# Patient Record
Sex: Male | Born: 1935 | Race: White | Hispanic: No | Marital: Married
Health system: Southern US, Community
[De-identification: ages and names within clinical notes are randomized; demographics above are authoritative.]

---

## 2003-05-08 ENCOUNTER — Other Ambulatory Visit: Payer: Self-pay

## 2003-05-15 ENCOUNTER — Other Ambulatory Visit: Payer: Self-pay

## 2005-03-17 ENCOUNTER — Ambulatory Visit: Payer: Self-pay | Admitting: Internal Medicine

## 2005-04-05 ENCOUNTER — Ambulatory Visit: Payer: Self-pay | Admitting: General Surgery

## 2006-04-05 ENCOUNTER — Other Ambulatory Visit: Payer: Self-pay

## 2006-04-05 ENCOUNTER — Inpatient Hospital Stay: Payer: Self-pay | Admitting: Unknown Physician Specialty

## 2010-09-11 ENCOUNTER — Emergency Department: Payer: Self-pay | Admitting: Emergency Medicine

## 2011-09-24 ENCOUNTER — Inpatient Hospital Stay: Payer: Self-pay | Admitting: Internal Medicine

## 2011-09-24 LAB — COMPREHENSIVE METABOLIC PANEL
Alkaline Phosphatase: 71 U/L (ref 50–136)
Anion Gap: 7 (ref 7–16)
BUN: 15 mg/dL (ref 7–18)
Calcium, Total: 10.4 mg/dL — ABNORMAL HIGH (ref 8.5–10.1)
Chloride: 104 mmol/L (ref 98–107)
Co2: 31 mmol/L (ref 21–32)
Creatinine: 1.02 mg/dL (ref 0.60–1.30)
EGFR (African American): >60
Potassium: 3.2 mmol/L — ABNORMAL LOW (ref 3.5–5.1)
SGOT(AST): 37 U/L (ref 15–37)
SGPT (ALT): 8 U/L — ABNORMAL LOW
Sodium: 142 mmol/L (ref 136–145)

## 2011-09-24 LAB — PROTIME-INR
INR: 1
Prothrombin Time: 13.4 secs (ref 11.5–14.7)

## 2011-09-24 LAB — CBC WITH DIFFERENTIAL/PLATELET
Basophil #: 0 x10 3/mm 3
Basophil %: 0.1 %
Eosinophil #: 0 x10 3/mm 3
Eosinophil %: 0.2 %
HCT: 42.5 %
HGB: 14.4 g/dL
Lymphocyte %: 11.4 %
Lymphs Abs: 1.4 x10 3/mm 3
MCH: 35 pg — ABNORMAL HIGH
MCHC: 34 g/dL
MCV: 103 fL — ABNORMAL HIGH
Monocyte #: 0.7 "x10 3/mm "
Monocyte %: 5.5 %
Neutrophil #: 10.2 x10 3/mm 3 — ABNORMAL HIGH
Neutrophil %: 82.8 %
Platelet: 148 x10 3/mm 3 — ABNORMAL LOW
RBC: 4.13 x10 6/mm 3 — ABNORMAL LOW
RDW: 11.9 %
WBC: 12.3 x10 3/mm 3 — ABNORMAL HIGH

## 2011-09-24 LAB — APTT: Activated PTT: 26.2 s

## 2011-09-25 LAB — URINALYSIS, COMPLETE
Bacteria: NONE SEEN
Bilirubin,UR: NEGATIVE
Glucose,UR: NEGATIVE mg/dL (ref 0–75)
Leukocyte Esterase: NEGATIVE
Protein: NEGATIVE
RBC,UR: 2 /HPF (ref 0–5)
Specific Gravity: 1.01 (ref 1.003–1.030)
Squamous Epithelial: NONE SEEN
WBC UR: 5 /HPF (ref 0–5)

## 2011-09-26 LAB — BASIC METABOLIC PANEL
Anion Gap: 10 (ref 7–16)
BUN: 34 mg/dL — ABNORMAL HIGH (ref 7–18)
Calcium, Total: 9.6 mg/dL (ref 8.5–10.1)
Chloride: 105 mmol/L (ref 98–107)
Co2: 31 mmol/L (ref 21–32)
Creatinine: 1.19 mg/dL (ref 0.60–1.30)
Glucose: 126 mg/dL — ABNORMAL HIGH (ref 65–99)
Osmolality: 300 (ref 275–301)
Potassium: 3.2 mmol/L — ABNORMAL LOW (ref 3.5–5.1)
Sodium: 146 mmol/L — ABNORMAL HIGH (ref 136–145)

## 2011-09-26 LAB — TROPONIN I: Troponin-I: 0.08 ng/mL — ABNORMAL HIGH

## 2011-09-27 LAB — CBC WITH DIFFERENTIAL/PLATELET
Eosinophil #: 0.1 10*3/uL (ref 0.0–0.7)
Eosinophil %: 0.5 %
Lymphocyte #: 1.5 10*3/uL (ref 1.0–3.6)
MCH: 35.9 pg — ABNORMAL HIGH (ref 26.0–34.0)
MCV: 107 fL — ABNORMAL HIGH (ref 80–100)
Monocyte #: 0.8 x10 3/mm (ref 0.2–1.0)
Neutrophil #: 10.4 10*3/uL — ABNORMAL HIGH (ref 1.4–6.5)
Platelet: 160 10*3/uL (ref 150–440)
RBC: 3.34 10*6/uL — ABNORMAL LOW (ref 4.40–5.90)
RDW: 12.4 % (ref 11.5–14.5)
WBC: 12.8 10*3/uL — ABNORMAL HIGH (ref 3.8–10.6)

## 2011-09-27 LAB — BASIC METABOLIC PANEL
BUN: 31 mg/dL — ABNORMAL HIGH (ref 7–18)
Co2: 31 mmol/L (ref 21–32)
EGFR (African American): >60
Glucose: 117 mg/dL — ABNORMAL HIGH (ref 65–99)
Potassium: 3.5 mmol/L (ref 3.5–5.1)

## 2011-09-28 LAB — CBC WITH DIFFERENTIAL/PLATELET
Basophil #: 0 10*3/uL (ref 0.0–0.1)
Basophil %: 0 %
Eosinophil #: 0 10*3/uL (ref 0.0–0.7)
Eosinophil %: 0.1 %
HCT: 37.1 % — ABNORMAL LOW (ref 40.0–52.0)
HGB: 12.4 g/dL — ABNORMAL LOW (ref 13.0–18.0)
Lymphocyte #: 0.9 10*3/uL — ABNORMAL LOW (ref 1.0–3.6)
Lymphocyte %: 8.1 %
MCH: 34.6 pg — ABNORMAL HIGH (ref 26.0–34.0)
MCV: 104 fL — ABNORMAL HIGH (ref 80–100)
Monocyte #: 0.8 x10 3/mm (ref 0.2–1.0)
Monocyte %: 7.6 %
Neutrophil #: 9.1 10*3/uL — ABNORMAL HIGH (ref 1.4–6.5)
Neutrophil %: 84.2 %
RBC: 3.57 10*6/uL — ABNORMAL LOW (ref 4.40–5.90)
RDW: 12.4 % (ref 11.5–14.5)
WBC: 10.8 10*3/uL — ABNORMAL HIGH (ref 3.8–10.6)

## 2011-09-28 LAB — BASIC METABOLIC PANEL
Anion Gap: 10 (ref 7–16)
Calcium, Total: 9.5 mg/dL (ref 8.5–10.1)
Chloride: 103 mmol/L (ref 98–107)
Co2: 31 mmol/L (ref 21–32)
EGFR (African American): >60
EGFR (Non-African Amer.): >60
Glucose: 112 mg/dL — ABNORMAL HIGH (ref 65–99)
Osmolality: 289 (ref 275–301)
Sodium: 144 mmol/L (ref 136–145)

## 2011-09-29 LAB — CBC WITH DIFFERENTIAL/PLATELET
Basophil #: 0 10*3/uL (ref 0.0–0.1)
Eosinophil #: 0.1 10*3/uL (ref 0.0–0.7)
Eosinophil %: 0.6 %
HCT: 31 % — ABNORMAL LOW (ref 40.0–52.0)
HGB: 10.4 g/dL — ABNORMAL LOW (ref 13.0–18.0)
Lymphocyte %: 11.6 %
MCHC: 33.5 g/dL (ref 32.0–36.0)
MCV: 104 fL — ABNORMAL HIGH (ref 80–100)
Monocyte #: 0.8 x10 3/mm (ref 0.2–1.0)
Monocyte %: 7.4 %
Neutrophil #: 8.2 10*3/uL — ABNORMAL HIGH (ref 1.4–6.5)
Neutrophil %: 80.3 %
RBC: 2.98 10*6/uL — ABNORMAL LOW (ref 4.40–5.90)
RDW: 11.8 % (ref 11.5–14.5)
WBC: 10.2 10*3/uL (ref 3.8–10.6)

## 2011-09-30 LAB — CBC WITH DIFFERENTIAL/PLATELET
Basophil #: 0 10*3/uL (ref 0.0–0.1)
Eosinophil #: 0.2 10*3/uL (ref 0.0–0.7)
HCT: 30.4 % — ABNORMAL LOW (ref 40.0–52.0)
HGB: 10.2 g/dL — ABNORMAL LOW (ref 13.0–18.0)
MCH: 35 pg — ABNORMAL HIGH (ref 26.0–34.0)
MCHC: 33.7 g/dL (ref 32.0–36.0)
Monocyte #: 0.6 x10 3/mm (ref 0.2–1.0)
RBC: 2.93 10*6/uL — ABNORMAL LOW (ref 4.40–5.90)
RDW: 12 % (ref 11.5–14.5)
WBC: 8.2 10*3/uL (ref 3.8–10.6)

## 2011-09-30 LAB — BASIC METABOLIC PANEL
BUN: 18 mg/dL (ref 7–18)
Creatinine: 0.94 mg/dL (ref 0.60–1.30)
EGFR (African American): >60
EGFR (Non-African Amer.): >60
Glucose: 115 mg/dL — ABNORMAL HIGH (ref 65–99)
Osmolality: 284 (ref 275–301)
Potassium: 2.9 mmol/L — ABNORMAL LOW (ref 3.5–5.1)
Sodium: 141 mmol/L (ref 136–145)

## 2011-10-01 LAB — BASIC METABOLIC PANEL
Anion Gap: 10 (ref 7–16)
BUN: 14 mg/dL (ref 7–18)
Chloride: 108 mmol/L — ABNORMAL HIGH (ref 98–107)
EGFR (African American): >60
EGFR (Non-African Amer.): >60
Osmolality: 286 (ref 275–301)
Potassium: 3.2 mmol/L — ABNORMAL LOW (ref 3.5–5.1)
Sodium: 143 mmol/L (ref 136–145)

## 2011-10-01 LAB — CBC WITH DIFFERENTIAL/PLATELET
Basophil #: 0 10*3/uL (ref 0.0–0.1)
Basophil %: 0.3 %
Eosinophil #: 0.3 10*3/uL (ref 0.0–0.7)
Eosinophil %: 4.4 %
HCT: 30.2 % — ABNORMAL LOW (ref 40.0–52.0)
HGB: 10.1 g/dL — ABNORMAL LOW (ref 13.0–18.0)
Lymphocyte #: 1.1 10*3/uL (ref 1.0–3.6)
Lymphocyte %: 14.1 %
MCH: 34.5 pg — ABNORMAL HIGH (ref 26.0–34.0)
MCV: 104 fL — ABNORMAL HIGH (ref 80–100)
Monocyte %: 8.4 %
Neutrophil #: 5.8 10*3/uL (ref 1.4–6.5)
Neutrophil %: 72.8 %
RBC: 2.92 10*6/uL — ABNORMAL LOW (ref 4.40–5.90)
RDW: 12 % (ref 11.5–14.5)

## 2011-10-02 LAB — HEMOGLOBIN: HGB: 10.7 g/dL — ABNORMAL LOW (ref 13.0–18.0)

## 2011-10-02 LAB — ELECTROLYTE PANEL
Anion Gap: 12 (ref 7–16)
Chloride: 108 mmol/L — ABNORMAL HIGH (ref 98–107)
Co2: 23 mmol/L (ref 21–32)
Potassium: 3.5 mmol/L (ref 3.5–5.1)
Sodium: 143 mmol/L (ref 136–145)

## 2011-10-04 LAB — CBC WITH DIFFERENTIAL/PLATELET
Basophil #: 0.1 10*3/uL (ref 0.0–0.1)
Eosinophil #: 0.2 10*3/uL (ref 0.0–0.7)
Eosinophil %: 2.4 %
HCT: 34.5 % — ABNORMAL LOW (ref 40.0–52.0)
Lymphocyte #: 1.3 10*3/uL (ref 1.0–3.6)
MCH: 34.4 pg — ABNORMAL HIGH (ref 26.0–34.0)
Monocyte #: 0.6 x10 3/mm (ref 0.2–1.0)
Monocyte %: 7.7 %
Neutrophil #: 5.9 10*3/uL (ref 1.4–6.5)
Neutrophil %: 73.1 %
Platelet: 265 10*3/uL (ref 150–440)
RDW: 12.1 % (ref 11.5–14.5)

## 2011-10-04 LAB — BASIC METABOLIC PANEL
Anion Gap: 7 (ref 7–16)
BUN: 14 mg/dL (ref 7–18)
Co2: 30 mmol/L (ref 21–32)
Creatinine: 0.87 mg/dL (ref 0.60–1.30)
EGFR (African American): >60
Osmolality: 285 (ref 275–301)
Potassium: 3.9 mmol/L (ref 3.5–5.1)

## 2011-10-04 LAB — PATHOLOGY REPORT

## 2011-10-05 ENCOUNTER — Encounter: Payer: Self-pay | Admitting: Internal Medicine

## 2011-10-23 ENCOUNTER — Emergency Department: Payer: Self-pay | Admitting: Emergency Medicine

## 2011-10-28 ENCOUNTER — Encounter: Payer: Self-pay | Admitting: Internal Medicine

## 2011-11-21 ENCOUNTER — Inpatient Hospital Stay: Payer: Self-pay | Admitting: Internal Medicine

## 2011-11-21 LAB — CBC WITH DIFFERENTIAL/PLATELET
Basophil #: 0 10*3/uL (ref 0.0–0.1)
Basophil %: 0.5 %
Eosinophil #: 0.1 10*3/uL (ref 0.0–0.7)
Eosinophil %: 1.8 %
HGB: 11.4 g/dL — ABNORMAL LOW (ref 13.0–18.0)
Lymphocyte %: 17 %
Monocyte #: 0.5 x10 3/mm (ref 0.2–1.0)
Monocyte %: 6.7 %
Neutrophil %: 74 %
RDW: 12.7 % (ref 11.5–14.5)

## 2011-11-21 LAB — URINALYSIS, COMPLETE
Bacteria: NONE SEEN
Bilirubin,UR: NEGATIVE
Blood: NEGATIVE
Glucose,UR: NEGATIVE mg/dL (ref 0–75)
Leukocyte Esterase: NEGATIVE
Nitrite: NEGATIVE
Ph: 8 (ref 4.5–8.0)
Protein: NEGATIVE
Specific Gravity: 1.004 (ref 1.003–1.030)

## 2011-11-21 LAB — BASIC METABOLIC PANEL
Anion Gap: 8 (ref 7–16)
Calcium, Total: 9.5 mg/dL (ref 8.5–10.1)
Chloride: 104 mmol/L (ref 98–107)
Creatinine: 0.72 mg/dL (ref 0.60–1.30)
EGFR (African American): >60
EGFR (Non-African Amer.): >60
Glucose: 112 mg/dL — ABNORMAL HIGH (ref 65–99)
Osmolality: 276 (ref 275–301)
Sodium: 138 mmol/L (ref 136–145)

## 2011-11-21 LAB — PROTIME-INR: INR: 0.9

## 2011-11-21 LAB — TROPONIN I: Troponin-I: <0.02 ng/mL

## 2011-11-21 LAB — VALPROIC ACID LEVEL: Valproic Acid: 6 ug/mL — ABNORMAL LOW

## 2011-11-21 LAB — CK TOTAL AND CKMB (NOT AT ARMC): CK-MB: 1.1 ng/mL (ref 0.5–3.6)

## 2011-11-21 LAB — APTT: Activated PTT: 31.4 secs (ref 23.6–35.9)

## 2011-11-22 LAB — CBC WITH DIFFERENTIAL/PLATELET
Basophil #: 0 10*3/uL (ref 0.0–0.1)
Basophil %: 0.6 %
Eosinophil %: 0.5 %
HCT: 31.3 % — ABNORMAL LOW (ref 40.0–52.0)
HGB: 10.5 g/dL — ABNORMAL LOW (ref 13.0–18.0)
Lymphocyte #: 1.4 10*3/uL (ref 1.0–3.6)
Lymphocyte %: 21.4 %
MCV: 104 fL — ABNORMAL HIGH (ref 80–100)
Neutrophil #: 4.4 10*3/uL (ref 1.4–6.5)
Platelet: 189 10*3/uL (ref 150–440)
RBC: 3.01 10*6/uL — ABNORMAL LOW (ref 4.40–5.90)

## 2011-11-22 LAB — CK TOTAL AND CKMB (NOT AT ARMC)
CK, Total: 47 U/L (ref 35–232)
CK-MB: 0.6 ng/mL (ref 0.5–3.6)
CK-MB: 0.9 ng/mL (ref 0.5–3.6)

## 2011-11-22 LAB — BASIC METABOLIC PANEL
Anion Gap: 9 (ref 7–16)
BUN: 10 mg/dL (ref 7–18)
Calcium, Total: 9.2 mg/dL (ref 8.5–10.1)
Co2: 26 mmol/L (ref 21–32)
EGFR (Non-African Amer.): >60
Glucose: 98 mg/dL (ref 65–99)
Osmolality: 280 (ref 275–301)
Potassium: 3.5 mmol/L (ref 3.5–5.1)
Sodium: 141 mmol/L (ref 136–145)

## 2011-11-22 LAB — TROPONIN I: Troponin-I: 0.02 ng/mL

## 2011-11-23 LAB — CBC WITH DIFFERENTIAL/PLATELET
Basophil #: 0 10*3/uL (ref 0.0–0.1)
Basophil %: 0.4 %
Eosinophil #: 0.1 10*3/uL (ref 0.0–0.7)
HCT: 28.8 % — ABNORMAL LOW (ref 40.0–52.0)
HGB: 9.7 g/dL — ABNORMAL LOW (ref 13.0–18.0)
Lymphocyte #: 1.3 10*3/uL (ref 1.0–3.6)
Lymphocyte %: 14.1 %
MCH: 35 pg — ABNORMAL HIGH (ref 26.0–34.0)
MCV: 105 fL — ABNORMAL HIGH (ref 80–100)
Monocyte #: 0.9 x10 3/mm (ref 0.2–1.0)
Neutrophil %: 74.4 %
Platelet: 175 10*3/uL (ref 150–440)
RDW: 12.7 % (ref 11.5–14.5)

## 2011-11-23 LAB — BASIC METABOLIC PANEL
Anion Gap: 6 — ABNORMAL LOW (ref 7–16)
BUN: 10 mg/dL (ref 7–18)
Calcium, Total: 8.4 mg/dL — ABNORMAL LOW (ref 8.5–10.1)
Chloride: 103 mmol/L (ref 98–107)
Creatinine: 0.89 mg/dL (ref 0.60–1.30)
EGFR (African American): >60
Glucose: 117 mg/dL — ABNORMAL HIGH (ref 65–99)

## 2011-11-24 LAB — ELECTROLYTE PANEL
Chloride: 107 mmol/L (ref 98–107)
Co2: 26 mmol/L (ref 21–32)
Potassium: 3.5 mmol/L (ref 3.5–5.1)
Sodium: 141 mmol/L (ref 136–145)

## 2011-11-25 ENCOUNTER — Encounter: Payer: Self-pay | Admitting: Internal Medicine

## 2011-12-13 ENCOUNTER — Encounter: Payer: Self-pay | Admitting: Internal Medicine

## 2012-02-08 ENCOUNTER — Ambulatory Visit: Payer: Self-pay | Admitting: Family Medicine

## 2012-03-08 ENCOUNTER — Other Ambulatory Visit: Payer: Self-pay | Admitting: Family Medicine

## 2012-03-08 LAB — BASIC METABOLIC PANEL
Anion Gap: 6 — ABNORMAL LOW (ref 7–16)
BUN: 29 mg/dL — ABNORMAL HIGH (ref 7–18)
Chloride: 121 mmol/L — ABNORMAL HIGH (ref 98–107)
Co2: 32 mmol/L (ref 21–32)
Creatinine: 1.43 mg/dL — ABNORMAL HIGH (ref 0.60–1.30)
Osmolality: 321 (ref 275–301)

## 2012-03-08 LAB — CBC WITH DIFFERENTIAL/PLATELET
Basophil #: 0 10*3/uL (ref 0.0–0.1)
HCT: 38.6 % — ABNORMAL LOW (ref 40.0–52.0)
Lymphocyte #: 1.3 10*3/uL (ref 1.0–3.6)
MCV: 100 fL (ref 80–100)
Monocyte %: 3.7 %
Neutrophil #: 8 10*3/uL — ABNORMAL HIGH (ref 1.4–6.5)
Neutrophil %: 80.9 %
Platelet: 230 10*3/uL (ref 150–440)
RDW: 14.7 % — ABNORMAL HIGH (ref 11.5–14.5)
WBC: 9.9 10*3/uL (ref 3.8–10.6)

## 2012-04-14 DEATH — deceased

## 2014-04-06 IMAGING — CR DG HIP 1V*L*
1 series · 1 of 1 positions shown · non-contrast
Comparison: none

REASON FOR EXAM: lateral only to evaluate for fracture s/p fall
COMMENTS:   Bedside (portable):Y

PROCEDURE:     DXR - DXR HIP LEFT ONE VIEW  - October 04, 2011 [DATE]
RESULT:     Crosstable lateral decubitus view of the left hip demonstrate
the patient status post left hip arthroplasty. No bone or hardware
complication is appreciated.

[x hip lat left]
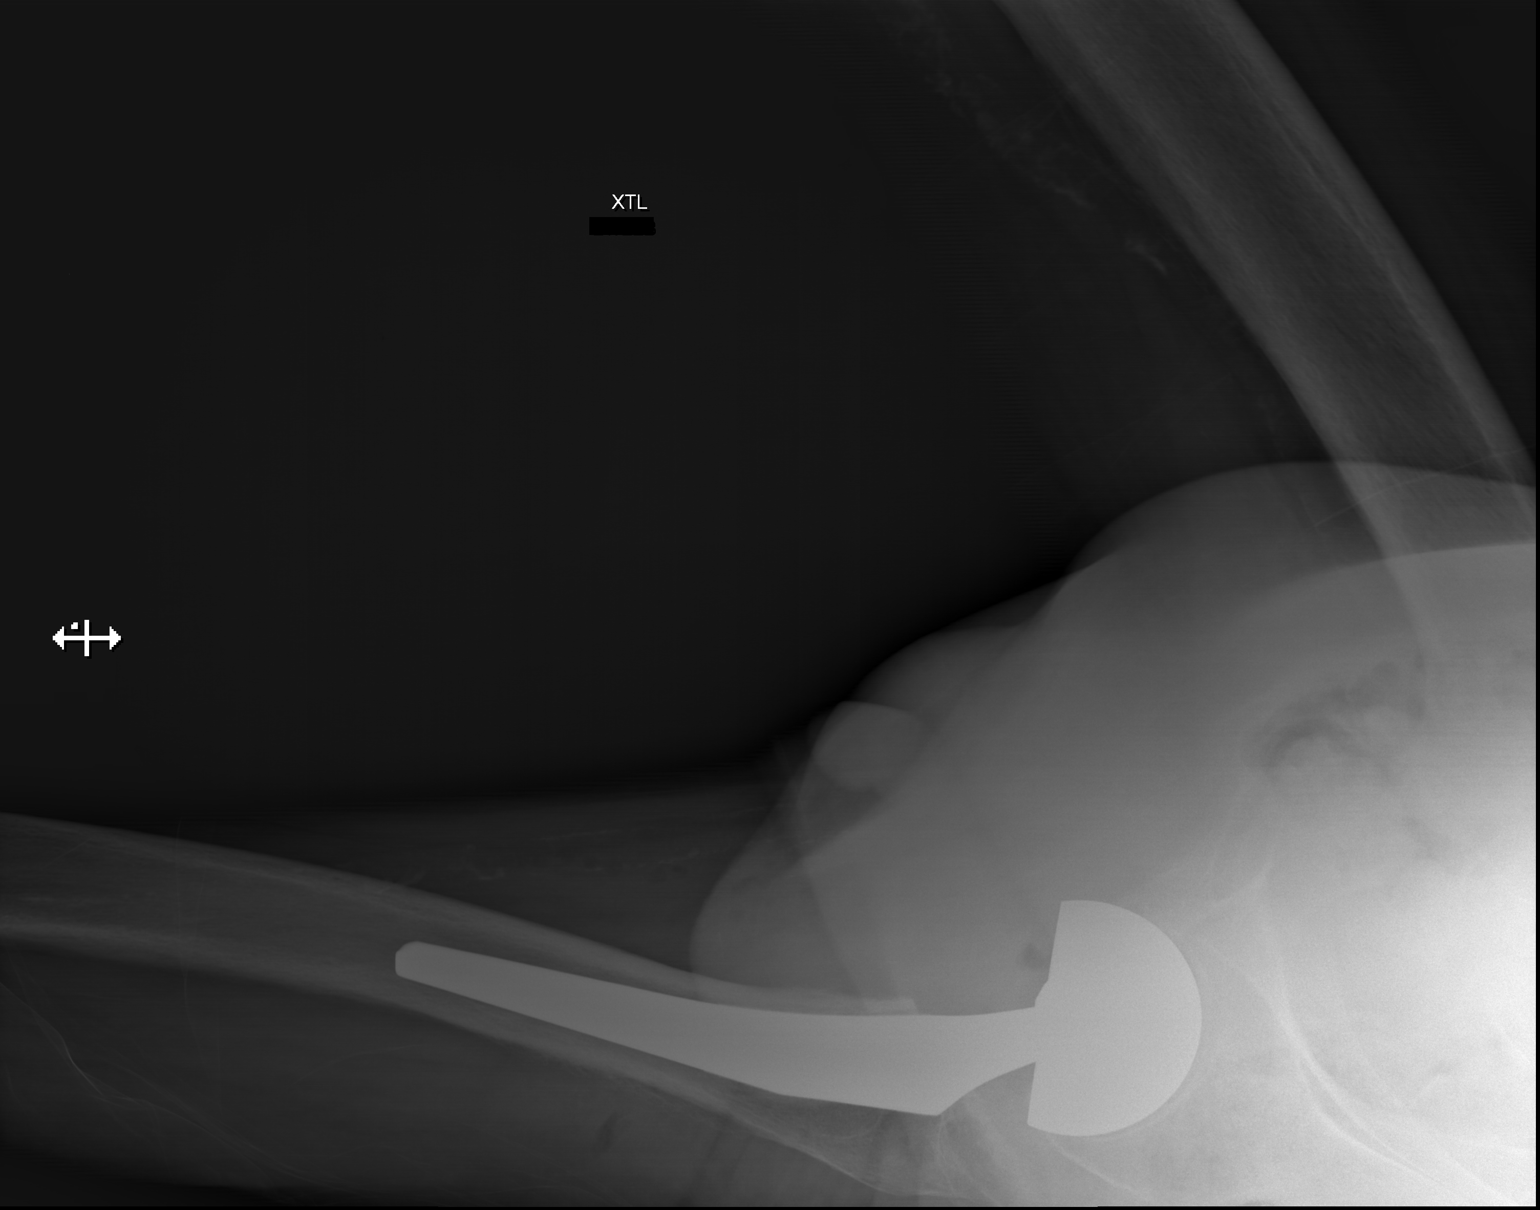

[1 of 1 positions shown; findings below may reference images not displayed]

IMPRESSION: Please see above.

[REDACTED]

## 2014-04-06 IMAGING — CR PELVIS - 1-2 VIEW
1 series · 2 of 2 positions shown · non-contrast
Comparison: none

REASON FOR EXAM: evaluate for fracture s/p fall
COMMENTS:   Bedside (portable):Y

PROCEDURE:     DXR - DXR PELVIS AP ONLY  - October 04, 2011 [DATE]
RESULT:     The patient is status post left hip arthroplasty. There is no
evidence of complication. Skin staples are present.

[Series 3: t pelvis ap · 0.14mm/px · 2 of 2 slices shown]
[im 1/2]
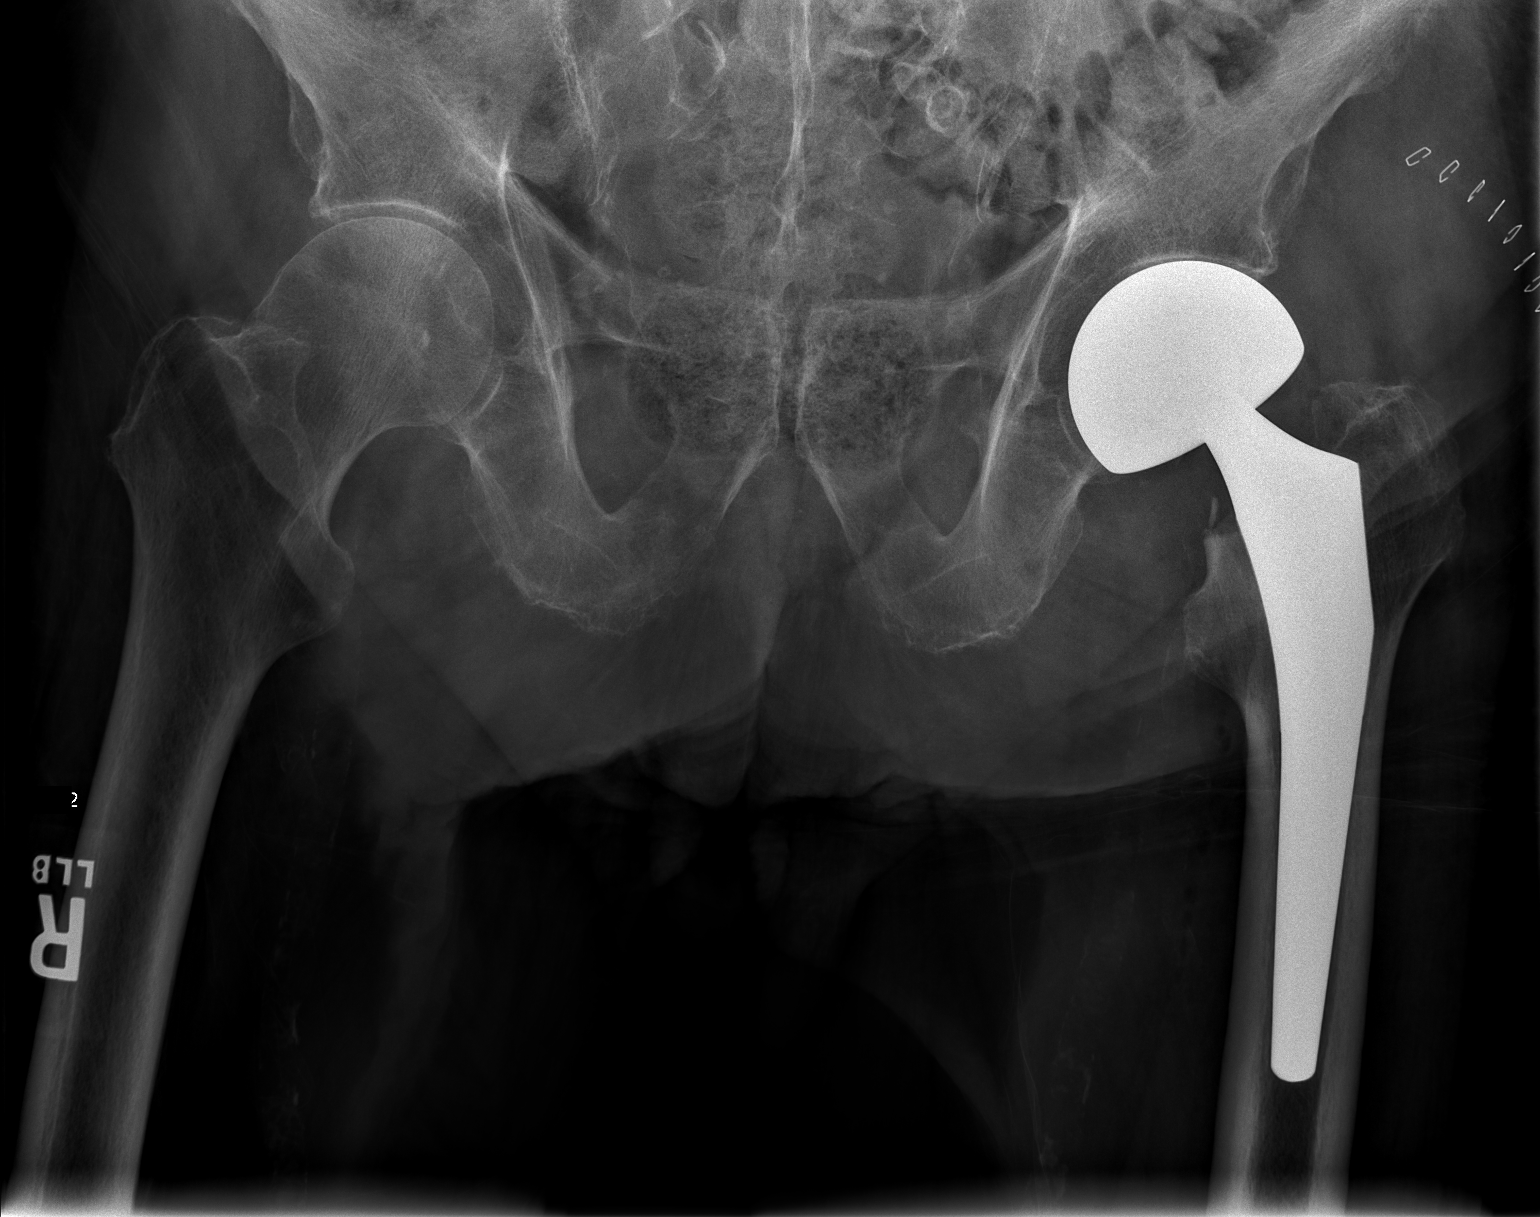
[im 2/2]
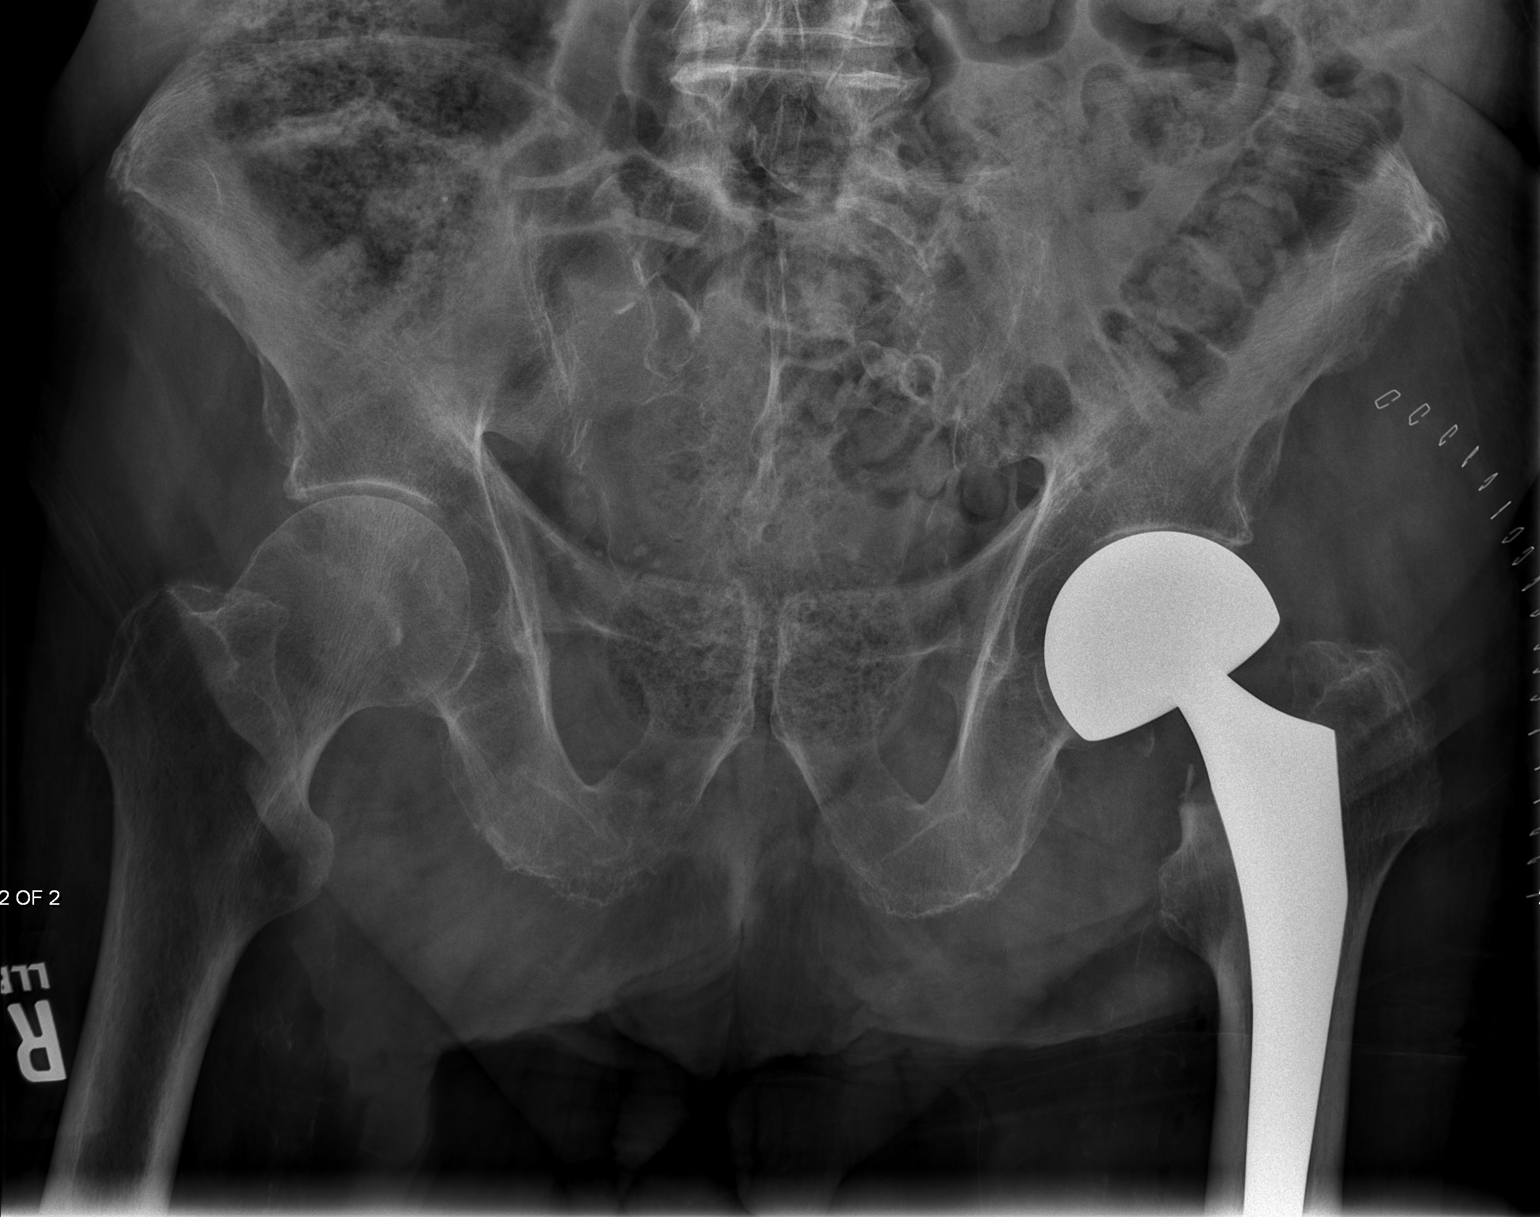

[2 of 2 positions shown; findings below may reference images not displayed]

IMPRESSION: Please see above.

[REDACTED]

## 2014-05-24 IMAGING — CT CT HEAD WITHOUT CONTRAST
2 series · 15 of 30 positions shown, 19 images · non-contrast
Comparison: none

REASON FOR EXAM: syncope   in x ray  fell yest
COMMENTS:

PROCEDURE:     CT  - CT HEAD WITHOUT CONTRAST  - November 21, 2011  [DATE]
RESULT:     Comparison:  None
TECHNIQUE: Multiple axial images from the foramen magnum to the vertex were
obtained without IV contrast.

[Series 2: without · axial · non-contrast · 0.45mm/px · z∈[-372,-252]mm · 13 of 30 slices shown, 17 images]
[im 3/30  brain]
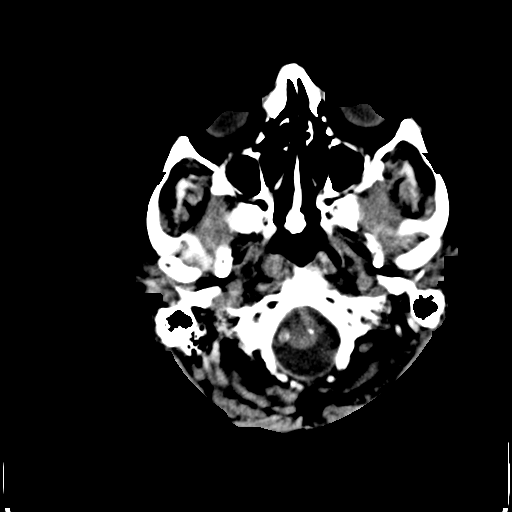
[im 3/30  bone]
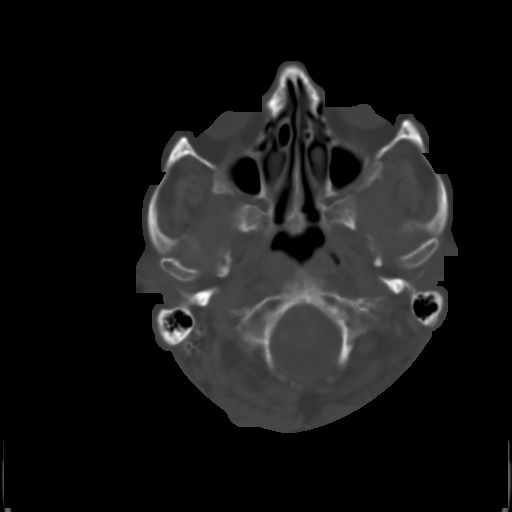
[im 5/30  brain]
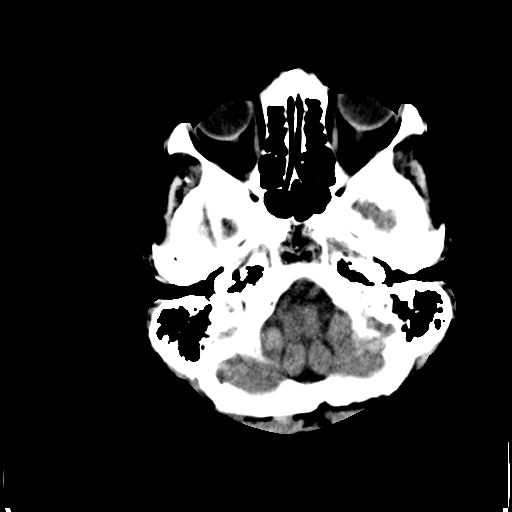
[im 7/30  brain]
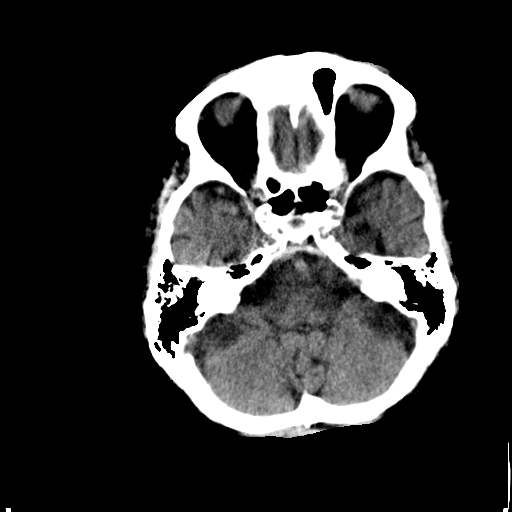
[im 9/30  brain]
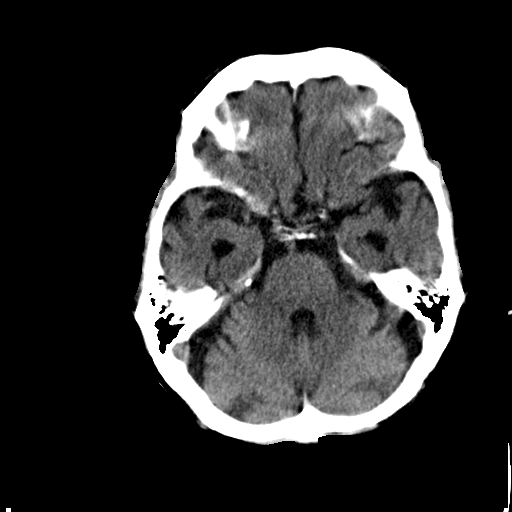
[im 11/30  brain]
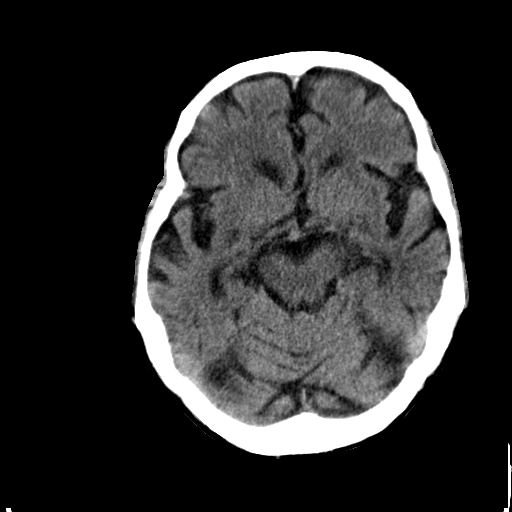
[im 11/30  bone]
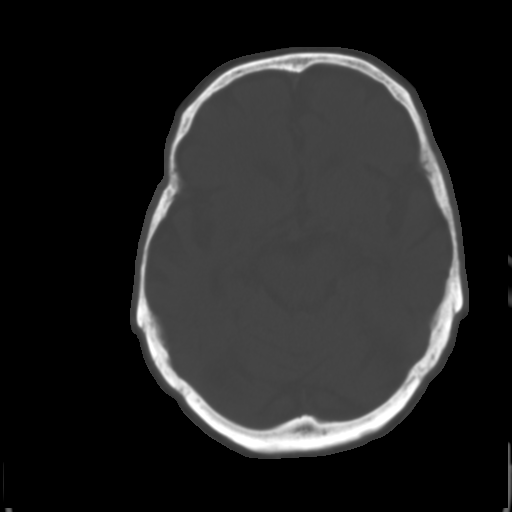
[im 13/30  brain]
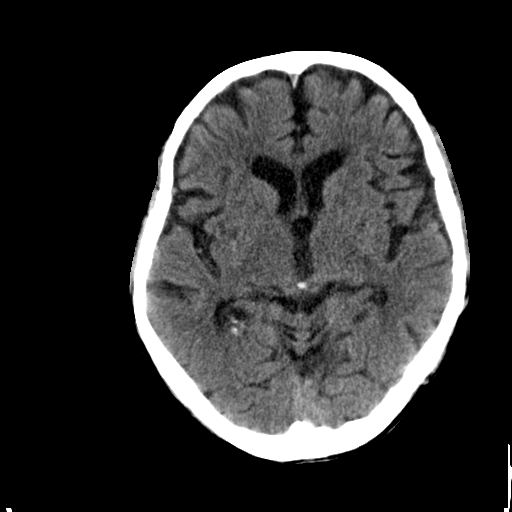
[im 15/30  brain]
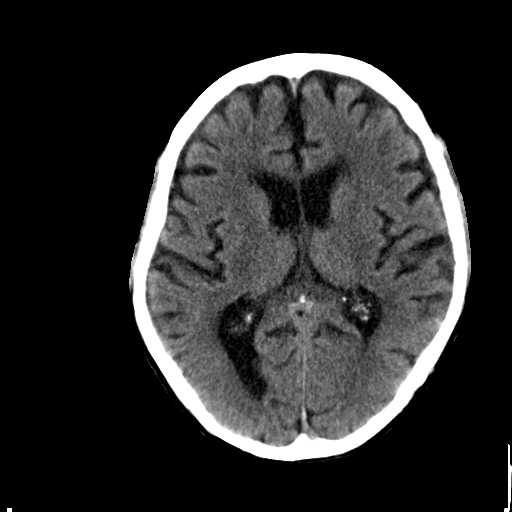
[im 17/30  brain]
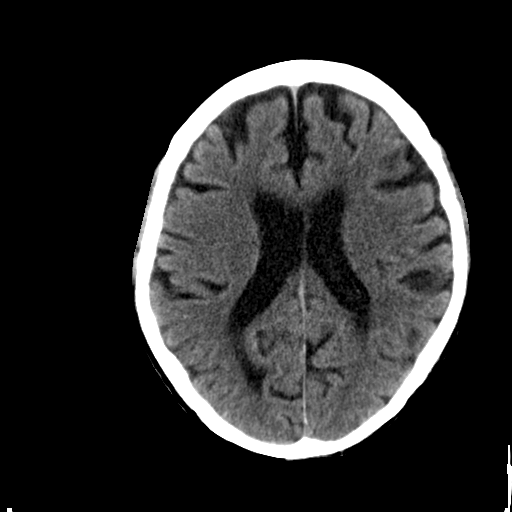
[im 19/30  brain]
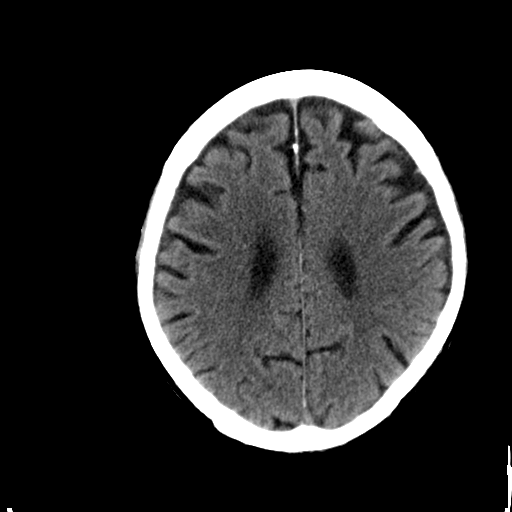
[im 19/30  bone]
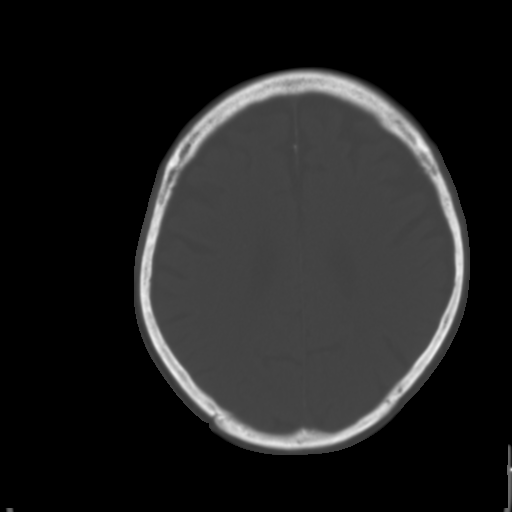
[im 21/30  brain]
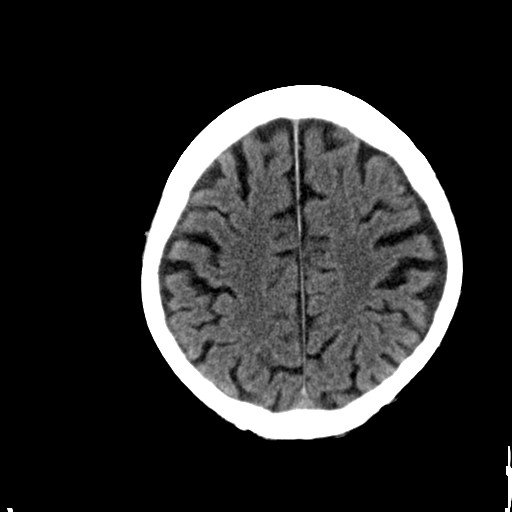
[im 23/30  brain]
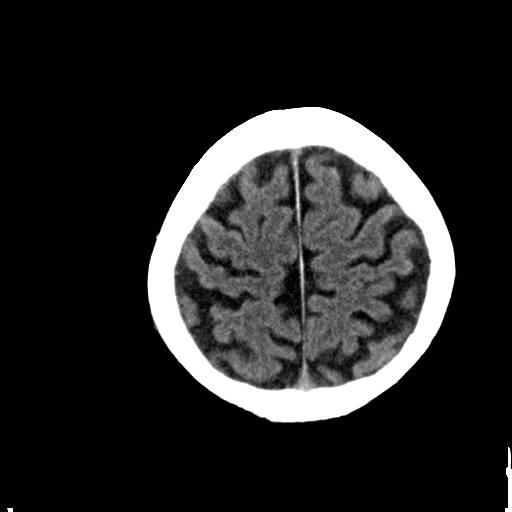
[im 25/30  brain]
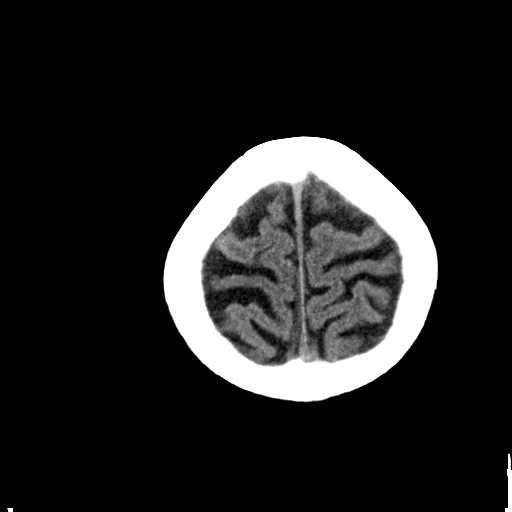
[im 27/30  brain]
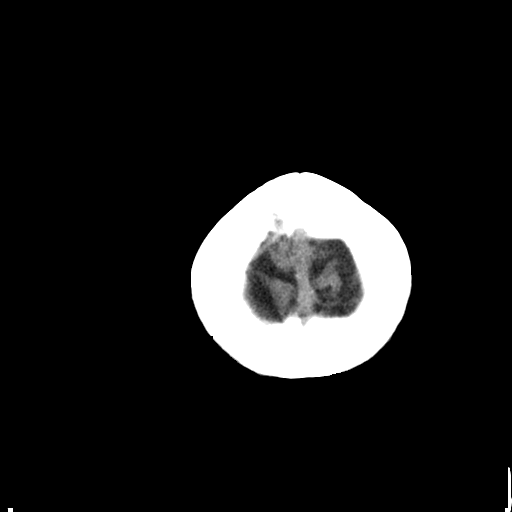
[im 27/30  bone]
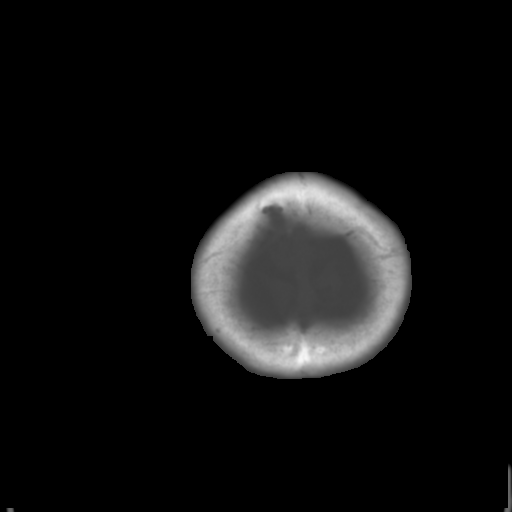

[Series 3: bone · axial · 0.45mm/px · z∈[-372,-352]mm · 2 of 30 slices shown]
[im 3/30  bone]
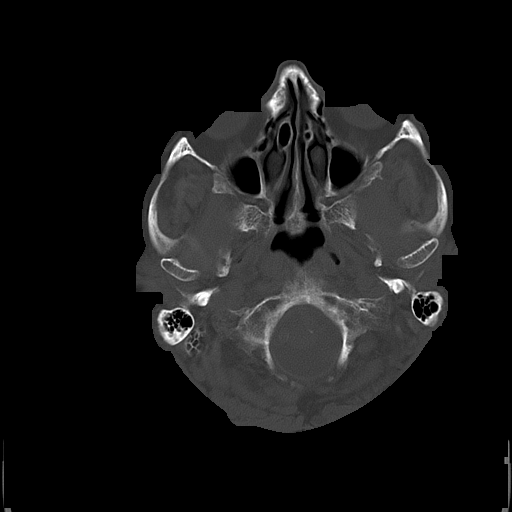
[im 7/30  bone]
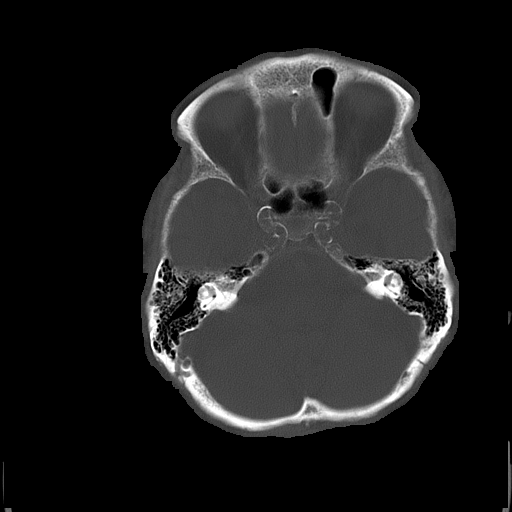

[15 of 30 positions shown; findings below may reference images not displayed]

FINDINGS: There is no evidence of mass effect, midline shift, or extra-axial fluid
collections.  There is no evidence of a space-occupying lesion or
intracranial hemorrhage. There is no evidence of a cortical-based area of
acute infarction. There is generalized cerebral atrophy. There is
periventricular white matter low attenuation likely secondary to
microangiopathy.

The ventricles and sulci are appropriate for the patient's age. The basal
cisterns are patent.

Visualized portions of the orbits are unremarkable. The visualized portions
of the paranasal sinuses and mastoid air cells are unremarkable.
Cerebrovascular atherosclerotic calcifications are noted.

The osseous structures are unremarkable.
IMPRESSION: No acute intracranial process.

[REDACTED]

## 2014-10-06 NOTE — Discharge Summary (Signed)
PATIENT NAME:  John Berg, Jelani MR#:  [id number] DATE OF BIRTH:  10-Aug-1917  DATE OF ADMISSION:  11/21/2011 DATE OF DISCHARGE:  11/24/2011  HISTORY OF PRESENT ILLNESS: John Berg is a 81 year old white gentleman who was sent in from [facility] after he fell and was complaining of pain in his right hip. In the ER he was found to have a right intratrochanteric hip fracture. While he was in x-ray he had episode of unresponsiveness that was probably syncope. He responded to a sternal rub and IV fluid resuscitation. Because of the episode, however, the patient was admitted to medicine rather than to orthopedics.   PAST MEDICAL HISTORY:  1. Hypertension.  2. Osteoarthritis.  3. Diverticulosis.  4. Glaucoma.  5. Benign prostatic hypertrophy.  6. Parkinson's disease. 7. Senile dementia. 8. Paroxysmal atrial fibrillation.   PAST SURGICAL HISTORY:  1. Previous parathyroidectomy in the distant past.  2. Left hip hemiarthroplasty in 09/2011.   ALLERGIES: Patient was noted to be allergic to IV contrast.   MEDICATIONS ON ADMISSION:  1. Amlodipine 5 mg daily.  2. Carbidopa levodopa 25/100 mg 1 tablet t.i.d.  3. Depakote 125 mg extended release at bedtime.  4. Docusate 240 mg at bedtime.  5. Aricept 5 mg at bedtime.  6. Ferrous sulfate 325 mg b.i.d.  7. Lasix 20 mg daily.  8. Latanoprost 0.005%, 1 drop to each eye daily. 9. MiraLax 1 packet in 8 ounces of water p.r.n.  10. Senna + 1 tablet b.i.d.  11. Flomax 0.4 mg daily.  12. Trospium 20 mg daily.   PHYSICAL EXAMINATION: Patient's admission physical examination was notable primarily for the patient's hip pain. He did have an irregular rhythm. 1+ pitting edema in both lower extremities.   LABORATORY, DIAGNOSTIC AND RADIOLOGIC DATA: The patient's admission laboratory work is detailed in the admission note but was most notable for hemoglobin of 11.4 with a hematocrit of 34.7. CT scan of the cervical spine showed degenerative  joint disease. An incidental finding was a 2 cm right thyroid mass that was incompletely characterized on the exam. CT scan of the head showed no acute process. CT scan of the abdomen and pelvis showed rectal wall thickening that was nonspecific. Chest x-ray showed no acute disease. Right hip film showed the fracture.   HOSPITAL COURSE: Patient was admitted to the regular medical floor where he was placed on telemetry. He was seen and cleared by cardiology for his surgery. He underwent a right hip pinning on 06/10 that was remarkable. He had no complications. He remained in atrial fibrillation throughout the hospitalization with good rate control despite no rate control medication. At the current time he is being transferred to rehab for further physical therapy and reconditioning.   DISCHARGE DIAGNOSES:  1. Right hip fracture.  2. Senile dementia.  3. Parkinson's disease.  4. Hypertension.  5. Paroxysmal atrial fibrillation.  6. Possible thyroid mass.   DISCHARGE MEDICATIONS:  1. Calcium carbonate 500 mg plus vitamin D 1 tablet b.i.d.  2. Norco 5/325 mg 1 to 2 tablets every six hours as needed.  3. Dulcolax suppository p.r.n.  4. Sinemet 25/100, 1 tablet t.i.d.  5. Depakote 125 mg at bedtime.  6. Aricept 5 mg at bedtime.  7. Lasix 20 mg daily.  8. Latanoprost eyedrops, 1 drop both eyes at bedtime.  9. Sanctura 20 mg daily.  10. Surfak 240 mg daily.  11. Ferrous sulfate 325 mg b.i.d.  12. Zofran 4 mg every six hours p.r.n.  13. MiraLax  17 grams daily.  14. Flomax 0.4 mg daily.  15. Fosamax 70 mg weekly.   DISCHARGE DISPOSITION: The patient is being discharged on a regular diet. He will continue to receive physical therapy while at rehab. He will eventually need follow up on his thyroid once he returns to assisted living. Follow up with Dr. Hyacinth Meeker in orthopedics will be arranged at rehab. The patient is a NO CODE.   ____________________________ Letta Pate. Danne Harbor,  MD jbw:cms D: 11/24/2011 07:41:19 ET T: 11/24/2011 08:05:04 ET JOB#: [id number]  cc: Jonny Ruiz B. Danne Harbor, MD, <Dictator> Elmo Putt III MD ELECTRONICALLY SIGNED 12/05/2011 17:32

## 2014-10-06 NOTE — Consult Note (Signed)
Brief Consult Note: Diagnosis: Left hip pain likely non-displaced femoral neck fracture.   Patient was seen by consultant.   Recommend further assessment or treatment.   Orders entered.   Comments: Spoke with the patient, his wife and son at the bedside.  Hip x-rays are suggestive of a left femoral neck fracture.  Am going to order more x-rays to try and evaluate the fracture further.  Will review these films and decide on surgery.  Patient may eat today.  Surgery tomorrow pending findings on todays films.  Family understands and agrees.  Will continue to follow.  Electronic Signatures: Juanell FairlyKrasinski, Ladawn Boullion (MD)  (Signed 13-Apr-13 11:49)  Authored: Brief Consult Note   Last Updated: 13-Apr-13 11:49 by Juanell FairlyKrasinski, Cloie Wooden (MD)

## 2014-10-06 NOTE — Op Note (Signed)
PATIENT NAME:  John LobeHOWERTON, Rishabh MR#:  [id number] DATE OF BIRTH:  10-29-1917  DATE OF PROCEDURE:  11/22/2011  PREOPERATIVE DIAGNOSIS: Displaced intertrochanteric fracture right hip.   POSTOPERATIVE DIAGNOSIS: Displaced intertrochanteric fracture right hip.   PROCEDURE PERFORMED: Open reduction and internal fixation right hip with Synthes DHS compression nail (140 degrees four hole plates, 90 mm lag screw, 4 compression screw, and 4 cortical screws).   SURGEON: Valinda HoarHoward E. Ronnie Mallette, MD  ANESTHESIA: Spinal.   COMPLICATIONS: None.   DRAINS: Two Hemovac.   ESTIMATED BLOOD LOSS: 150 mL.   REPLACEMENTS: None.   DESCRIPTION OF PROCEDURE: Patient was brought to the Operating Room where he underwent satisfactory spinal anesthesia and was placed in supine position on the fracture table. The left leg was flexed and abducted and padded appropriately. The right leg was placed in traction and internally rotated. The hip was prepped and draped in sterile fashion. Lateral incision made on the proximal femur. Dissection was carried out sharply through skin and subcutaneous tissue and fascia. The vastus lateralis muscle was elevated up off of the femur and a 1/4-inch drill hole made in the lateral cortex below the vastus ridge. The guidepin was inserted under fluoroscopic control at 140 degrees angle and was seen to be in excellent alignment on AP and lateral view. Step cut reamer was used to enlarge the track in the femoral head and neck and a 90 mm lag screw was inserted. A 140 degrees four hole plate was attached. The traction was released and a compression screw applied. Four cortical screw holes were drilled and filled. The compression screw was tightened fully. Fluoroscopy showed excellent alignment of the fracture and the hardware. The wounds were irrigated. The deep fascia was closed with 0 Vicryl over a Hemovac drain and subcutaneous tissue was closed with 2-0 Vicryl over another Hemovac. The skin was closed  with staples. Dry sterile dressing was applied. Patient was taken out of traction and transferred to his hospital bed and taken to recovery room in good condition. He had good alignment of the legs.   ____________________________ Valinda HoarHoward E. Kirra Verga, MD hem:cms D: 11/22/2011 11:05:07 ET T: 11/22/2011 13:01:35 ET JOB#: [id number]  cc: Valinda HoarHoward E. Khamila Bassinger, MD, <Dictator> Valinda HoarHOWARD E Shavell Nored MD ELECTRONICALLY SIGNED 11/22/2011 15:01

## 2014-10-06 NOTE — H&P (Signed)
PATIENT NAME:  John Berg, John Berg MR#:  [id number] DATE OF BIRTH:  June 17, 1917  DATE OF ADMISSION:  09/24/2011  ADMITTING PHYSICIAN: Curtis SitesBert J. Klein, III, MD/Indi Willhite Mel Almondumey, PA-C  CHIEF COMPLAINT: Left knee pain.   HISTORY OF PRESENT ILLNESS: The patient is a 81 year old with history of Parkinson's, mild dementia, hypertension, and degenerative arthritis who yesterday at home was without his walker on uneven gravel and fell. His son tried to assist him with standing, and he was unable to bear weight because of left knee pain. This has continued over the last 24 hours, and he has fallen again because of inability to bear weight. He denies any new symptoms of chest pain, shortness of breath, fevers, chills, cough. He does complain of some pain radiating to the foot and towards the left hip. He also has an abrasion to the left elbow from the fall. He lives with his wife, who is also around 79 years old, and she is unable to assist him in the home.   PAST MEDICAL HISTORY:  1. Hypertension.  2. Degenerative arthritis.  3. Diverticulosis.  4. Glaucoma.  5. Degenerative disk disease.  6. Benign prostatic hypertrophy.  7. Parkinson's.  8. Dementia.   PAST SURGICAL HISTORY: Parathyroidectomy.   CURRENT MEDICATIONS:  1. Lasix 40 mg daily.  2. Multivitamin daily.  3. MiraLax 17 grams daily.  4. Potassium chloride 20 mEq daily.  5. Amlodipine 5 mg daily.  6. Flomax 0.4 mg daily.  7. Sinemet 25/100 t.i.d.  8. Latanoprost drops 0.005%, 1 drop both eyes every night.  9. Sanctura 20 mg daily.   ALLERGIES: IV contrast.   FAMILY HISTORY: Positive for diabetes, heart disease. He has a sister who had breast cancer. Colon cancer diagnosed in daughter in 2002. No history of prostate cancer.   SOCIAL HISTORY: He does not smoke, drink, or use recreational drugs. He has chewed tobacco in the past.   REVIEW OF SYSTEMS: As per history of present illness, he has had a weight loss over the last year of about 5 to  7 pounds. He does have no new changes in vision, though he does have glaucoma. No current fevers, chills or fatigue. He has decreased hearing and wears hearing aids. He has had poor appetite over the last 24 hours. No shortness of breath or cough, congestion. No chest pain or new swelling in his feet. He had some mild abdominal discomfort. He does have a history of urinary frequency and is on medication. He has seen Urology. He does have history of idiopathic Parkinson's and is followed by Neurology.   PHYSICAL EXAMINATION:  GENERAL: Elderly male who presents on stretcher, transported by EMS.    HEENT: Head: Normocephalic, atraumatic. Eyes: Pupils are equal, round, and reactive to light. Noninjected conjunctiva. Extraocular muscles are intact. Sclerae are anicteric. Oropharynx: Clear with upper and lower dentures with mild redness in the posterior pharynx. Uvula midline. Ears: His ears show landmarks, normal with no erythema. Some wax in the left canal.   NECK: Supple with no adenopathy. No thyromegaly or carotid bruits.   HEART: Regular rhythm with rate in the 90s. No murmurs noted.   LUNGS: Exam shows clear breath sounds bilaterally.   ABDOMEN: Soft with mild generalized tenderness.   SKIN: Warm and dry. He has a small laceration 1/4 cm to the left elbow, not bleeding, no erythema.    EXTREMITIES: No edema. Distal pulses are intact.   MUSCULOSKELETAL: Tenderness in the left knee without effusion. He is tender along the  joint line and patella. His left groin is tender on palpation. Limited range of motion.   LABORATORY, DIAGNOSTIC AND RADIOLOGICAL DATA: X-ray of the left knee shows severe arthritis with bone on bone medially. No evident fracture.   ASSESSMENT/PLAN:  1. Left knee pain, severe arthritis: X-ray performed in the Clinic. Admitted due to pain and unable to bear weight. Schedule a MRI. Consult Orthopedics. He can have Norco 5/325, 1/2 to 1 t.i.d. p.r.n.  2. Fall: I will check  routine labs. Continue current medications.  3. Hypertension: Continue amlodipine.  4. Parkinson's: Continue Sinemet.    ____________________________ Marveen Reeks. Jenaro Souder, PA-C rjt:cbb D: 09/24/2011 17:18:16 ET T: 09/24/2011 17:44:06 ET JOB#: [id number]  cc: Marveen Reeks. Dionisio David, <Dictator> Alisia Ferrari PA ELECTRONICALLY SIGNED 09/30/2011 9:54

## 2014-10-06 NOTE — Op Note (Signed)
PATIENT NAME:  John Berg, John MR#:  [id number] DATE OF BIRTH:  [birth year]  DATE OF PROCEDURE:  09/27/2011  PREOPERATIVE DIAGNOSIS: Left displaced femoral neck hip fracture.   POSTOPERATIVE DIAGNOSIS: Left displaced femoral neck hip fracture.   PROCEDURE: Left hip hemiarthroplasty.   SURGEON: Kathreen DevoidKevin L. Sherill Wegener, MD   ANESTHESIA: Spinal.   ESTIMATED BLOOD LOSS: 100 mL.   COMPLICATIONS: None.   IMPLANTS: Stryker Accolade size 4 HFX stem with a 55 mm outer diameter bipolar head component.   INDICATIONS FOR PROCEDURE: John Berg is a 81 year old male who sustained a fall at home. He was walking without his walker on uneven ground and fell. His son tried to help him to his feet, but he was unable to bear weight because of left lower extremity pain. The patient had no medical prodromal symptoms. He was admitted to the Medical Service. Initial left knee x-rays were negative, but then a second round of x-rays were performed which found the patient to have a femoral neck hip fracture. Orthopedics was consulted for further evaluation and management. After discussions with the patient, his son, and his wife, the decision was made to proceed with a left hip hemiarthroplasty. Given the fracture location and the displaced nature of the fracture, this was the recommended treatment for this injury. I reviewed the risks and benefits of surgery with the patient and his son. These include infection, bleeding, nerve or blood vessel injury, especially to the sciatic nerve leading to foot drop, leg length discrepancy, change in lower extremity rotation, fracture, dislocation, and the need for further surgery including conversion to a total hip arthroplasty. The patient may have persistent pain. Other medical complications include deep vein thrombosis and pulmonary embolism, myocardial infarction, respiratory failure, pneumonia, and death. The patient's son signed the consent form, and I answered all of his  questions.   DESCRIPTION OF PROCEDURE: The patient was brought to the Operating Room on 09/27/2011. He underwent spinal anesthesia and was then placed in a right lateral decubitus position. The patient was held in position with a pegboard. All bony prominences were adequately padded. An axillary roll was placed on and the patient's right side, and blankets were placed under the right leg to avoid any compression injury to the common peroneal nerve. The patient was then prepped and draped in sterile fashion.   A timeout was performed to verify the patient's name, date of birth, medical record number, correct site of surgery, and correct procedure to be performed. It was also used to verify the patient had received antibiotics and that all appropriate instruments, implants, and radiographic studies were available in the room. Once all in attendance were in agreement, the case began. I had signed the left hip with the word "yes" according to the hospital's right site protocol.   The patient had a curvilinear incision made with a #10 blade over the greater trochanter. Subcutaneous tissues were dissected using electrocautery until the fascia lata was identified. This was cleared of overlying soft tissue using a Art therapistKey elevator. The fascia lata was then incised using a deep #10 blade. The gluteus maximus muscle fibers were split bluntly in line with their fibers. This revealed the underlying hip bursa. This was resected. The external rotators were then identified, including the piriformis, and removed from their attachments the posterior greater trochanter and reflected posteriorly after being tagged for later repair. The capsule was then visualized. A T-shaped capsulotomy was performed, and both leaflets of the capsule were tagged for later repair. The fracture  was then easily identified. A straight osteotome was used to separate the fractured head from the neck. The head was removed and measured to be a size 55 mm in  diameter. The trial 55 mm head was then placed within the acetabulum and found to have an excellent fit. The acetabulum was copiously irrigated. The attention was then turned to femoral preparation.   A femoral skid was placed under the femoral neck. The left hip was then internally rotated. Two cobra retractors were then placed along the neck to allow for adequate visualization. A neck cutting template was held into position, and electrocautery was used to mark the line for a femoral osteotomy. An oscillating saw was used to make a proximal femoral osteotomy to allow for a smooth neck cut. This was approximately one fingerbreadth above the lesser trochanter. A box osteotome was then used to make the initial insertion into the proximal femur. A single hand reamer and canal finder were then placed to initiate femoral canal preparation. Sequential broaches starting with a size 0 were then inserted into the femoral canal with approximately 15 degrees of anteversion. The patient was found to have the best medial and lateral fit with a size 4. The trial neck and 55 mm head were then placed on the broach component, and the hip was reduced. This was found to have excellent full range of motion and excellent stability. The leg lengths were equivalent. All trial components were then removed. The hip joint was copiously irrigated. The actual size 4 Stryker Accolade HFX stem was then inserted into the femoral canal and was well seated. The trial 55 mm head was then placed on this stem and reduced. It was again taken through a full range of motion and deemed to be stable with equivalent leg lengths. The trial 55 head was then removed, and a 55 mm bipolar head was then inserted onto the size 4 stem. The hip was reduced. The hip joint was then copiously irrigated. The T-shaped capsulotomy was then repaired using a #2 Tycron. A posterior repair of the capsule was made through drill holes through the greater trochanter. The  piriformis was also were repaired posteriorly using a free needle. Again, the hip joint was copiously irrigated. The fascia lata was closed using interrupted 0 Vicryl suture and the skin closed in two layers with a 0 Vicryl and 2-0 Vicryl. The skin was approximated with Monocryl. A dry sterile dressing was applied. The patient was then rotated onto his back. Leg lengths were found to be equivalent on the operative table. A hip abduction pillow was placed between his knees. He was then transferred to a hospital bed and brought to the PAC-U in stable condition.   I was scrubbed and present for the entire case and all sharp and instrument counts were correct at the conclusion of the case. I spoke with the patient's family in the postoperative waiting room to let them know the case had gone without complication and that the patient was doing well in recovery.    ____________________________ Kathreen Devoid, MD klk:cbb D: 09/30/2011 12:50:57 ET T: 09/30/2011 13:07:15 ET JOB#: [id number]  cc: Kathreen Devoid, MD, <Dictator> Kathreen Devoid MD ELECTRONICALLY SIGNED 10/04/2011 17:56

## 2014-10-06 NOTE — Discharge Summary (Signed)
PATIENT NAME:  John Berg, John Berg MR#:  [id number] DATE OF BIRTH:  [birth year]  DATE OF ADMISSION:  09/24/2011 DATE OF DISCHARGE:  10/04/2011  HISTORY OF PRESENT ILLNESS: The patient is a 81 year old gentleman who presented to the office on the day of admission after a fall at home. He was complaining of being unable to bear weight on his left knee and cannot ambulate. The patient was therefore admitted for further evaluation where he was found to have a left hip fracture.   PAST MEDICAL HISTORY:  1. Hypertension.  2. Degenerative arthritis.  3. Diverticulosis.  4. Glaucoma.  5. Degenerative disk disease. 6. Benign prostatic hypertrophy. 7. Parkinson's disease. 8. Senile dementia.   PAST SURGICAL HISTORY: Previous parathyroidectomy.   MEDICATIONS ON ADMISSION:  1. Lasix 40 mg daily.  2. Multivitamin once daily.  3. MiraLax 17 grams daily. 4. Potassium chloride 20 mEq daily. 5. Amlodipine 5 mg daily.  6. Flomax 0.4 mg daily. 7. Sinemet 25/100, 1 tablet b.i.d.  8. Sanctura 20 mg daily.  9. Latanoprost eyedrops 0.005%, one drop both eyes each night.   ALLERGIES: Patient was noted to be allergic to IV contrast.   ADMISSION PHYSICAL EXAMINATION: As described by the admitting physician was notable for his moderate dementia. He also had pain which he referred to as being in the left knee. He was noted to have a small laceration to the left elbow.   LABORATORY, DIAGNOSTIC AND RADIOLOGICAL DATA: The patient's admission CBC showed a hemoglobin of 14.4 with a hematocrit of 42.5. White count was 12,300. Platelet count was 148,000. Admission comprehensive metabolic panel showed a random blood sugar of 115. Potassium was 3.2. Calcium was 10.4. Total bilirubin was 1.5. The remainder of the panel was unremarkable. Urinalysis showed trace blood but was otherwise unremarkable. Electrocardiogram showed a sinus rhythm with occasional PACs and PVCs. There was left axis deviation. Nonspecific ST-T wave  changes were present. Routine radiographs of the left hip showed a left femoral neck fracture. Chest x-ray showed mild cardiomegaly but was otherwise unremarkable.   HOSPITAL COURSE: The patient was admitted to the regular medical floor where he was placed at bed rest. He was seen in consultation by orthopedics and eventually taken for a left hemiarthroplasty on 04/15. His hospital course was initially complicated by hypotension, which resolved with fluid challenges and holding his regular blood pressure medication. The patient was also very confused for several days following his surgery but was felt to be due to sundowner and he eventually return to his normal demented baseline state by the time of transfer. The patient's other problem that occurred during the hospitalization was he developed marked purulent drainage from his left elbow. It was examined by the orthopedist and felt to be an infected skin tear, not a bursitis. He eventually grew out staph aureus that was not MRSA. He was placed on vancomycin IV initially but then was switched to IV Levaquin and bridged to oral Levaquin.   DISCHARGE DIAGNOSIS: Left hip fracture with left hip hemiarthroplasty.   DISCHARGE MEDICATIONS:  1. Tylenol 650 mg every four hours as needed.  2. Amlodipine 5 mg daily.  3. Dulcolax suppository 10 mg at bedtime p.r.n.  4. Calcium carbonate 500 mg plus vitamin D 200 units 1 tablet b.i.d.  5. Sinemet 25/100, 1 tablet t.i.d.  6. Surfak 240 mg at bedtime.  7. Ferrous sulfate 325 mg b.i.d.  8. Haldol 0.5 mg every two hours by mouth p.r.n. agitation.  9. Latanoprost eyedrops 0.005%, one drop both  eyes at bedtime.  10. Milk of magnesia 30 mL at bedtime p.r.n.  11. Sanctura 20 mg daily.  12. MiraLax 17 grams daily.  13. Flomax 0.4 mg daily.  14. Levaquin 250 mg daily for an additional five days.   DISPOSITION: The patient is being transferred to a rehab facility for further physical therapy and rehabilitation prior  to returning home.   CODE STATUS: Patient is a FULL CODE.   ____________________________ Letta PateJohn B. Danne HarborWalker III, MD jbw:cms D: 10/04/2011 08:05:30 ET T: 10/04/2011 08:33:59 ET JOB#: [id number]  cc: Letta PateJohn B. Danne HarborWalker III, MD, <Dictator> Elmo PuttJOHN B WALKER III MD ELECTRONICALLY SIGNED 10/04/2011 12:52

## 2014-10-06 NOTE — H&P (Signed)
PATIENT NAME:  John Berg, John Berg MR#:  [id number] DATE OF BIRTH:  [birth year]  DATE OF ADMISSION:  11/21/2011  PRIMARY CARE PHYSICIAN: John Berg, [facility]    CHIEF COMPLAINT: Right hip pain status post fall.   HISTORY OF PRESENT ILLNESS: John Berg is a very pleasant 81 year old Caucasian gentleman is not the best historian and is very hard on hearing comes to the Emergency Room from [facility] after he sustained a fall while he was trying to walk to his wheelchair. Patient lost balance and was an accidental fall and complains of pain on the right hip. In the ER was found to have right intertrochanteric hip fracture. Patient did mainly complain of right knee pain when he came in. He went down to x-ray, was placed on the table and the radiology staff found him unresponsive. CODE BLUE was called. ER physician went by and could feel the radial pulse and systolic blood pressure at that time was in the 90s. Patient had sternal rub and he came around rather quickly. He did not require any IV medications. Patient was brought back to the Emergency Room. His EKG was done and it showed atrial fibrillation with heart rate in the 70s. Internal medicine was asked to admit the patient given the above "syncopal episode". Dr. Deeann Saint saw patient in the Emergency Room and is awaiting medical clearance for the hip repair. Next, in the Emergency Room, patient since the episode has been in atrial fibrillation, heart rate is around in the 70s. Patient denies any chest pain or shortness of breath. Patient's son who was present in the Emergency Room gave most of the history. His name is John Berg.   PAST MEDICAL HISTORY:  1. Hypertension.  2. Degenerative arthritis.  3. Diverticulosis. 4. Glaucoma.  5. Benign prostatic hypertrophy.  6. Parkinson's disease.  7. Senile dementia.  8. History of atrial fibrillation by EKG which appears to be paroxysmal. EKG in March 2012 showed atrial  fibrillation. EKG in 09/2011 showed sinus rhythm with PACs and EKG today shows atrial fibrillation so patient likely has paroxysmal atrial fibrillation  PAST SURGICAL HISTORY:  1. Left hip hemiarthroplasty in April 2013.  2. Parathyroidectomy.   ALLERGIES: IV contrast.  FAMILY HISTORY: From old records positive for diabetes and heart disease. Patient's sister had breast cancer.   SOCIAL HISTORY: He is a resident at [facility] He was just discharge from rehab last Wednesday after his left hemiarthroplasty. No history of smoking. He has chewed tobacco in the past.   REVIEW OF SYSTEMS: Per patient he is complaining of right hip pain. He is very hard on hearing. Denies any chest pain or shortness of breath. Unable to get much detailed review of systems.   MEDICATIONS:  1. Amlodipine 5 mg p.o. daily.  2. Carbidopa levodopa 25/100, 1 tablet t.i.d.   3. Divalproex 125 mg extended-release at bedtime.  4. Docusate 240 p.o. at bedtime.  5. Donepezil 5 mg at bedtime.  6. Ferrous sulfate 325 mg twice a day.  7. Lasix 20 mg daily.  8. Latanoprost 0.005% one drop to each eye daily for glaucoma.  9. One-A-Day men's vitamin.  10. Polyethylene glycol 1 packet in 8 ounces of water p.r.n.  11. Senna + 1 tablet b.i.d.  12. Tamsulosin 0.4 mg p.o. daily.  13. Trospium 20 mg p.o. daily, this is for bladder dysfunction.  14. Tylenol caplet extra strength 500 mg 2 tablets every eight hours as needed for back pain.  CODE STATUS: DO NOT RESUSCITATE. This was confirmed with patient's son, John Berg, who is his HPOA.   PHYSICAL EXAMINATION:  GENERAL: Patient is awake. He is hard on hearing.    VITAL SIGNS: Afebrile, pulse 74 to 78 atrial fibrillation, blood pressure 132/67, sats 97% on 2 liters.   HEENT: Atraumatic, normocephalic. Pupils are equal, round, and reactive to light and accommodation. Extraocular movements intact. Oral mucosa is moist.   NECK: Supple. No JVD. No carotid  bruit.   RESPIRATORY: Decreased breath sounds in the bases. No respiratory distress or labored breathing.   CARDIOVASCULAR: Both the heart sounds are normal, irregularly irregular heart rhythm. No murmur heard. PMI not lateralized. Chest nontender.   EXTREMITIES: Patient does have pitting edema 1+ in both the lower extremities. Good pedal pulses, good femoral pulses.   ABDOMEN: Soft, benign, nontender. No organomegaly. Positive bowel sounds.   NEUROLOGIC: Patient is awake. He is alert. He is oriented x2. He is hard on hearing. He did answer most of my questions, basic questions appropriately. He is moving all the extremities well other than his right lower extremity, unable to do so due to right hip pain.   NEUROLOGICAL: Limited secondary to his hip fracture at present.   LABORATORY, DIAGNOSTIC AND RADIOLOGICAL DATA: Basic metabolic panel within normal limits. Cardiac enzymes, first set is negative. Comprehensive metabolic panel within normal limits, except hemoglobin and hematocrit of 11.4 and 34.7, platelet count 220, MCV 104.   CT cervical spine shows multilevel degenerative joint disease. Patient has a 2 cm right thyroid mass incompletely characterized on this exam. Recommend thyroid ultrasound.   CT of the head without contrast no acute intracranial process.   CT of the abdomen and pelvis without contrast shows rectal wall thickening with surrounding perirectal inflammatory changes with surrounding fat. There is a mildly enlarged left perirectal lymph node measuring 13 mm. This may be related to proctitis. Mildly comminuted right intertrochanteric fracture-dislocation.   Chest x-ray: No acute disease of the chest.   Right hip shows right intertrochanteric fracture.   ASSESSMENT AND PLAN:  1. Syncope/patient found unresponsive momentarily on the x-ray table today. CODE BLUE was called. ER M.D. did feel radial pulse, his blood pressure was stable when he was wheeled back to the  Emergency Room. His blood pressure on my evaluation was in the 130s. EKG showed atrial fibrillation, 78. Will admit patient to orthopedics floor for John Berg. Cycles cardiac enzymes x3. IV fluids. Check echo in the morning. I did speak with Dr. Juliann Pares on call for [facility] cardiology and did discuss the event. Patient will need cardiology clearance prior to surgery. Patient is hence being admitted on the medical service and then once cleared by cardiology patient can be transferred to Dr. Rondel Baton service. Hypertension. Continue amlodipine.  2. Right hip fracture status post fall today at [facility]. Patient will be taken to surgery once cleared by cardiology in the morning. Patient can thereby be transferred to Dr. Rondel Baton service.  3. Parkinson's dementia. Continue carbidopa levodopa.  4. Benign prostatic hypertrophy on tamsulosin.  5. Questionable perirectal inflammation is noted on CT abdomen, appears to be incidental. Patient does not have any rectal pain, will continue to monitor.  6. Right thyroid mass, again noted on CT of the cervical spine. Can do further work-up as outpatient. Will defer it to primary care physician, Dr. Dan Humphreys.  7. Continue rest of the home medications.  8. Deep vein thrombosis prophylaxis per Dr. Hyacinth Meeker.  9. Above all was discussed with patient's son, Arita MissBilly Hughlett. Patient is a DO NOT RESUSCITATE. Code was addressed son reported patient wishes to be a DO NOT RESUSCITATE.   TIME SPENT: 55 minutes.   ____________________________ Wylie HailSona A. Allena KatzPatel, MD sap:cms D: 11/21/2011 17:20:02 ET T: 11/22/2011 07:46:09 ET JOB#: [id number] cc: Danniella Robben A. Allena KatzPatel, MD, <Dictator> John B. Danne HarborWalker III, MD Willow OraSONA A Randall Rampersad MD ELECTRONICALLY SIGNED 12/04/2011 15:43

## 2014-10-06 NOTE — Consult Note (Signed)
PATIENT NAME:  John Berg, John Berg MR#:  [id number] DATE OF BIRTH:  11-Sep-1917  DATE OF CONSULTATION:  09/26/2011  REFERRING PHYSICIAN:  Dr. Martha ClanKrasinski  CONSULTING PHYSICIAN:  Lamar BlinksBruce J. Valory Wetherby, MD  PRIMARY CARE PHYSICIAN: Dr. Dan HumphreysWalker   REASON FOR CONSULTATION: Cardiovascular issues and concerns with hip surgery.   CHIEF COMPLAINT: Patient is somewhat demented and family gave history.   HISTORY OF PRESENT ILLNESS: This is a 81 year old male with known hypertension and no other previous cardiovascular history who had a fall twice yesterday and broke his hip. Patient is nonambulatory at this time. Prior to this he was ambulatory with a walker and has had no evidence of chest pain or shortness of breath to any great extent suggesting heart failure or true angina. He has been on hypertension medications with Norvasc with good control in the past. EKG shows normal sinus rhythm with preventricular contractions, left axis deviation and nonspecific T wave changes and no evidence of myocardial infarction. The patient has had no evidence of elevation of troponin either. The patient has no other electrical activity issues or rhythm disturbances suggesting any true syncope. The patient has had exam with some valvular heart disease but does not sound significant. Currently the patient has had no evidence of shortness of breath. He is stable at this time with good blood pressure and heart rate control.   REVIEW OF SYSTEMS: Remainder of review of systems cannot be assessed due to dementia.   PAST MEDICAL HISTORY:  1. Hypertension.  2. Valvular heart disease.  3. Parkinson's.   FAMILY HISTORY: No family members with early onset of cardiovascular disease.   SOCIAL HISTORY: Currently denies alcohol or tobacco use.   ALLERGIES: He has no known drug allergies.    CURRENT MEDICATIONS: As listed.   PHYSICAL EXAMINATION:  VITAL SIGNS: Blood pressure 126/68 bilaterally, heart rate 72 upright, reclining, and  regular.   GENERAL: He is a well-appearing male in no acute distress.   HEENT: No icterus, thyromegaly, ulcers, hemorrhage, or xanthelasma.   CARDIOVASCULAR: Regular rate and rhythm with normal S1, S2 with a 2/6 apical murmur consistent with mitral regurgitation. Point of maximal impulse is normal size and placement. Carotid upstroke normal without bruit. Jugular venous pressure is normal.   LUNGS: Lungs have few basilar crackles with normal respirations.   ABDOMEN: Soft, nontender without hepatosplenomegaly or masses. Abdominal aorta is normal size without bruit.   EXTREMITIES: 2+ bilateral pulses in dorsal, pedal, radial, and femoral arteries without lower extremity edema, cyanosis, clubbing, ulcers.   NEUROLOGIC: The patient is not oriented.   ASSESSMENT: 81 year old male with hypertension, moderate valvular heart disease, slightly abnormal EKG without evidence of previous myocardial infarction and currently no evidence of angina or congestive heart failure at mild to moderate risk of cardiovascular disease or complication with hip surgery. Patient and family have been discussed about significance of surgery and risks and benefits and wish to proceed which was reasonable. We have discussed other medical management options for further risk reduction and surgery.   RECOMMENDATIONS:  1. Proceed to surgery for improvement and rehabilitation and no restrictions to rehabilitation or surgery.  2. Hypertension and heart rate control in surgery as needed.  3. Postoperative telemetry to assess for rhythm disturbances, preventricular contractions.  4. No further cardiac diagnostics prior to surgery.  5. Consider Lasix if patient has pulmonary edema or congestive heart failure-type symptoms postoperatively.  6. Further evaluation potentially as an outpatient of echocardiogram for valvular heart disease extent and adjustments of medications thereof  as necessary.   ____________________________ Lamar Blinks, MD bjk:cms D: 09/26/2011 08:45:00 ET T: 09/26/2011 11:21:59 ET JOB#: [id number]  cc: Lamar Blinks, MD, <Dictator> Lamar Blinks MD ELECTRONICALLY SIGNED 09/27/2011 13:46

## 2014-10-06 NOTE — Op Note (Signed)
PATIENT NAME:  John Berg, Kutter MR#:  [id number] DATE OF BIRTH:  1918-04-01  DATE OF PROCEDURE:  09/27/2011  PREOPERATIVE DIAGNOSIS: Urethral false passage.   POSTOPERATIVE DIAGNOSIS: Urethral false passage.   PROCEDURES:  1. Cystoscopy.  2. Catheter placement.   SURGEON: Assunta GamblesBrian Ford Peddie, M.D.   ANESTHESIA: Spinal.   INDICATIONS: The patient is a 81 year old gentleman who is scheduled to undergo orthopedic surgery. An attempt at catheterization by the operating room staff was unsuccessful. Urological consultation was obtained for catheter placement.   DESCRIPTION OF PROCEDURE: The patient was prepped in the standard fashion. Cystoscopy was performed with a 17 French flexible cystoscope. At the level of the bulbar urethra, an area of trauma was encountered. It appears that the catheter may have folded back on itself resulting in a false passage. There was no evidence of stricture. The scope was able to be advanced beyond the level of the false passage through the prostate. The prostate was noted to be relatively short with moderate bilobar hypertrophy. Upon entering the bladder, the mucosa was inspected in its entirety with no gross mucosal lesions noted. A guidewire was placed into the urinary bladder. A 16 [place] catheter was inserted over the guidewire into the urinary bladder. This was placed to gravity drainage. The patient tolerated the procedure well. There          were no problems or complications. The patient was prepped for the remainder of the procedure, which will be dictated under a separate operative note.  ____________________________ Madolyn FriezeBrian S. Achilles Dunkope, MD bsc:slb D: 09/27/2011 21:34:00 ET T: 09/28/2011 09:33:01 ET JOB#: [id number]  cc: Madolyn FriezeBrian S. Achilles Dunkope, MD, <Dictator> Madolyn FriezeBRIAN S Jaskirat Zertuche MD ELECTRONICALLY SIGNED 09/29/2011 20:49

## 2014-10-06 NOTE — Consult Note (Signed)
Brief Consult Note: Diagnosis: Right hip fracture.   Patient was seen by consultant.   Recommend to proceed with surgery or procedure.   Recommend further assessment or treatment.   Orders entered.   Discussed with Attending MD.   Comments: [age] year old male fell at assisted living today getting up to go to bathroom.  Brought to Emergency Room where exam and X-rays show a displaced intertrochanteric fracture right hip.  Had syncopal episode in X-ray.  He has dementia. Have discussed fracture with his son and recommend surgical fixation with plate/screws and he agrees.  Will plan surgery when cleared by surgery.  Exam:  confused but alert.  circulation/sensation/motor function good right leg.  short and rotated.  skin good.  pain with range of motion of hip.  left hip stable and healed.    X-rays:  displaced intertrochanteric fracture right hip  Rx:  open reduction and internal fixation when stable.  Electronic Signatures: Valinda HoarMiller, Alecia Doi E (MD)  (Signed 09-Jun-13 15:47)  Authored: Brief Consult Note   Last Updated: 09-Jun-13 15:47 by Valinda HoarMiller, Kiona Blume E (MD)
